# Patient Record
Sex: Female | Born: 1985 | Race: White | Hispanic: No | Marital: Single | Smoking: Never smoker
Health system: Southern US, Community
[De-identification: ages and names within clinical notes are randomized; demographics above are authoritative.]

## PROBLEM LIST (undated history)

## (undated) DIAGNOSIS — J45909 Unspecified asthma, uncomplicated: Secondary | ICD-10-CM

## (undated) HISTORY — DX: Unspecified asthma, uncomplicated: J45.909

## (undated) HISTORY — PX: TONSILLECTOMY AND ADENOIDECTOMY: SHX28

## (undated) HISTORY — PX: DILATION AND CURETTAGE OF UTERUS: SHX78

---
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
## 2013-02-17 ENCOUNTER — Encounter: Payer: Self-pay | Admitting: *Deleted

## 2013-03-01 ENCOUNTER — Ambulatory Visit (INDEPENDENT_AMBULATORY_CARE_PROVIDER_SITE_OTHER): Payer: 59 | Admitting: Nurse Practitioner

## 2013-03-01 ENCOUNTER — Encounter: Payer: Self-pay | Admitting: Nurse Practitioner

## 2013-03-01 VITALS — BP 123/77 | HR 80 | Ht 62.0 in | Wt 150.4 lb

## 2013-03-01 DIAGNOSIS — Z0000 Encounter for general adult medical examination without abnormal findings: Secondary | ICD-10-CM

## 2013-03-01 DIAGNOSIS — Z01419 Encounter for gynecological examination (general) (routine) without abnormal findings: Secondary | ICD-10-CM

## 2013-03-01 DIAGNOSIS — J45990 Exercise induced bronchospasm: Secondary | ICD-10-CM

## 2013-03-01 DIAGNOSIS — Z124 Encounter for screening for malignant neoplasm of cervix: Secondary | ICD-10-CM

## 2013-03-01 MED ORDER — BECLOMETHASONE DIPROPIONATE 80 MCG/ACT IN AERS: 1.0000 | INHALATION_SPRAY | Freq: Two times a day (BID) | RESPIRATORY_TRACT | Status: DC

## 2013-03-01 NOTE — Patient Instructions (Signed)
Nexplanon

## 2013-03-02 LAB — PAP IG W/ RFLX HPV ASCU

## 2013-03-05 DIAGNOSIS — J45990 Exercise induced bronchospasm: Secondary | ICD-10-CM | POA: Insufficient documentation

## 2013-03-05 NOTE — Assessment & Plan Note (Signed)
Add Qvar as directed. Continue albuterol inhaler before exercise. Call back if no improvement.

## 2013-03-05 NOTE — Progress Notes (Signed)
Subjective:    Patient ID: Jacqueline Swanson, female    DOB: February 28, 1986, 27 y.o.   MRN: [id number]  HPI presents for wellness checkup. Her last Pap smear was 2009 and according to patient was normal. Same sexual partner. No vaginal discharge. No pelvic pain. Is off her Depo-Provera due to side effects particularly weight gain and moodiness. Eating healthy diet. Staying active. Has not had a visual exam. Regular dental exams. According to patient she was diagnosed with 5 episodes of acute bronchitis at the local urgent care center from October to February this year. Was told she probably has asthma. Has frequent tightness and wheezing with intense exercise. Uses her inhaler right before activity and generally sometime during or after exercise. This began about 6-8 months ago. Mild problem with hot humid weather. Otherwise does not occur at any other time. Nonsmoker. Having very light cycles since coming off Depo-Provera. Would like to get Nexplanon.    Review of Systems  Constitutional: Negative for fever, activity change and appetite change.  HENT: Negative for ear pain, congestion, sore throat, rhinorrhea, dental problem and sinus pressure.   Eyes: Negative for visual disturbance.  Respiratory: Positive for shortness of breath and wheezing. Negative for cough and chest tightness.   Cardiovascular: Negative for chest pain and palpitations.  Gastrointestinal: Negative for nausea, vomiting, abdominal pain, diarrhea, constipation and blood in stool.  Genitourinary: Negative for vaginal discharge, difficulty urinating, menstrual problem and pelvic pain.  Skin: Negative for rash.  Neurological: Negative for headaches.       Objective:   Physical Exam  Vitals reviewed. Constitutional: She is oriented to person, place, and time. She appears well-developed. No distress.  HENT:  Right Ear: External ear normal.  Left Ear: External ear normal.  Mouth/Throat: Oropharynx is clear and moist.  Eyes:  Conjunctivae are normal. Pupils are equal, round, and reactive to light.  Neck: Normal range of motion. Neck supple. No tracheal deviation present. No thyromegaly present.  Cardiovascular: Normal rate, regular rhythm and normal heart sounds.  Exam reveals no gallop.   No murmur heard. Pulmonary/Chest: Effort normal and breath sounds normal. She has no wheezes.  Abdominal: Soft. She exhibits no distension. There is no tenderness.  Genitourinary: Vagina normal and uterus normal. No vaginal discharge found.  Musculoskeletal: She exhibits no edema.  Lymphadenopathy:    She has no cervical adenopathy.  Neurological: She is alert and oriented to person, place, and time.  Skin: Skin is warm and dry. No rash noted.  Psychiatric: Her behavior is normal.   breast exam no masses noted. Axilla no adenopathy. External GU normal limit. Vagina moist, no discharge noted. Cervix normal limit in appearance. No CMT. Uterus and adnexa normal limit and nontender.     Assessment & Plan:  Well woman exam - Plan: Pap IG w/ reflex to HPV when [facility], Ambulatory referral to Gynecology  Exercise-induced asthma  Screening for cervical cancer - Plan: Pap IG w/ reflex to HPV when [facility]  Meds ordered this encounter  Medications  . acetaminophen (TYLENOL) 500 MG tablet    Sig: Take 500 mg by mouth every 6 (six) hours as needed for pain.  . beclomethasone (QVAR) 80 MCG/ACT inhaler    Sig: Inhale 1 puff into the lungs 2 (two) times daily.    Dispense:  1 Inhaler    Refill:  11    Order Specific Question:  Supervising Provider    Answer:  Merlyn Albert [[id number]]   Continue albuterol inhaler 30 minutes  before exercise. Rinse mouth or brush teeth after using Qvar. Continues healthy diet and exercise. Discussed calcium and vitamin D supplementation. Call back if no improvement in her breathing. Refer to gynecology for Nexplanon insertion. Next physical in one year.

## 2013-03-27 ENCOUNTER — Ambulatory Visit (INDEPENDENT_AMBULATORY_CARE_PROVIDER_SITE_OTHER): Payer: 59 | Admitting: Women's Health

## 2013-03-27 ENCOUNTER — Encounter: Payer: Self-pay | Admitting: Women's Health

## 2013-03-27 VITALS — BP 120/70 | Ht 72.0 in | Wt 147.6 lb

## 2013-03-27 DIAGNOSIS — Z3009 Encounter for other general counseling and advice on contraception: Secondary | ICD-10-CM

## 2013-03-27 DIAGNOSIS — Z3049 Encounter for surveillance of other contraceptives: Secondary | ICD-10-CM

## 2013-03-27 NOTE — Progress Notes (Signed)
S: Jacqueline Swanson is a 27 y.o. female who is here today to discuss contraception.  Was on depo, but she had significant weight gain and depression/mood swings. Last injection was app 9-12 months ago per pt. Has been using condoms. Desires nexplanon, discussed r/b. Last sexual intercourse 7/6- used condom. Patient's last menstrual period was 03/07/2013.   O: BP 120/70  Ht 6' (1.829 m)  Wt 147 lb 9.6 oz (66.951 kg)  BMI 20.01 kg/m2  LMP 03/07/2013   A: Contraception counseling  P: No sex until nexplanon placed     Visit 6/16 or after for bHCG am and nexplanon insertion pm  Cheron Every [place], [place] 03/27/2013 12:57 PM

## 2013-03-27 NOTE — Patient Instructions (Signed)

## 2013-04-09 ENCOUNTER — Other Ambulatory Visit: Payer: Self-pay | Admitting: Adult Health

## 2013-04-09 ENCOUNTER — Encounter: Payer: Self-pay | Admitting: Women's Health

## 2013-04-09 ENCOUNTER — Ambulatory Visit (INDEPENDENT_AMBULATORY_CARE_PROVIDER_SITE_OTHER): Payer: 59 | Admitting: Women's Health

## 2013-04-09 ENCOUNTER — Other Ambulatory Visit: Payer: 59

## 2013-04-09 VITALS — BP 120/70 | Ht 62.0 in | Wt 149.5 lb

## 2013-04-09 DIAGNOSIS — Z3046 Encounter for surveillance of implantable subdermal contraceptive: Secondary | ICD-10-CM

## 2013-04-09 DIAGNOSIS — Z30017 Encounter for initial prescription of implantable subdermal contraceptive: Secondary | ICD-10-CM

## 2013-04-09 LAB — HCG, QUANTITATIVE, PREGNANCY: hCG, Beta Chain, Quant, S: <1 m[IU]/mL

## 2013-04-09 NOTE — Progress Notes (Signed)
Jacqueline Swanson is a 27 y.o. year old Caucasian female here for Nexplanon insertion.  Her LMP was 03/31/13, last sexual intercourse 03/30/13 and her bHCG pregnancy test today was negative.  Risks/benefits/side effects of Nexplanon have been discussed and her questions have been answered.  Specifically, a failure rate of 09/998 has been reported, with an increased failure rate if pt takes St. John's Wort and/or antiseizure medicaitons.  [name] is aware of the common side effect of irregular bleeding, which the incidence of decreases over time.  Her left arm, approximatly 4 inches proximal from the elbow, was cleansed with alcohol and anesthetized with 2cc of 2% Lidocaine.  The area was cleansed again w/ betadine and the Nexplanon was inserted without difficulty.  A steri-strip and pressure bandage were applied.  Pt was instructed to remove pressure bandage in a 24 hours, and keep insertion site covered with steri-strip for 3-5 days.  Back up contraception was recommended for 2 weeks.  Follow-up scheduled PRN problems  Exp: 05/2015 Lot: [id number]  Marge Duncans 04/09/2013 2:44 PM

## 2013-04-09 NOTE — Patient Instructions (Signed)
Please leave big bandage on for 24hours. The smaller strip you will take off in 3-5 days. Use condoms as back up protection for 2 weeks.   Etonogestrel implant What is this medicine? ETONOGESTREL is a contraceptive (birth control) device. It is used to prevent pregnancy. It can be used for up to 3 years. This medicine may be used for other purposes; ask your health care provider or pharmacist if you have questions. What should I tell my health care provider before I take this medicine? They need to know if you have any of these conditions: -abnormal vaginal bleeding -blood vessel disease or blood clots -cancer of the breast, cervix, or liver -depression -diabetes -gallbladder disease -headaches -heart disease or recent heart attack -high blood pressure -high cholesterol -kidney disease -liver disease -renal disease -seizures -tobacco smoker -an unusual or allergic reaction to etonogestrel, other hormones, anesthetics or antiseptics, medicines, foods, dyes, or preservatives -pregnant or trying to get pregnant -breast-feeding How should I use this medicine? This device is inserted just under the skin on the inner side of your upper arm by a health care professional. Talk to your pediatrician regarding the use of this medicine in children. Special care may be needed. Overdosage: If you think you've taken too much of this medicine contact a poison control center or emergency room at once. Overdosage: If you think you have taken too much of this medicine contact a poison control center or emergency room at once. NOTE: This medicine is only for you. Do not share this medicine with others. What if I miss a dose? This does not apply. What may interact with this medicine? Do not take this medicine with any of the following medications: -amprenavir -bosentan -fosamprenavir This medicine may also interact with the following medications: -barbiturate medicines for inducing sleep or  treating seizures -certain medicines for fungal infections like ketoconazole and itraconazole -griseofulvin -medicines to treat seizures like carbamazepine, felbamate, oxcarbazepine, phenytoin, topiramate -modafinil -phenylbutazone -rifampin -some medicines to treat HIV infection like atazanavir, indinavir, lopinavir, nelfinavir, tipranavir, ritonavir -St. John's wort This list may not describe all possible interactions. Give your health care provider a list of all the medicines, herbs, non-prescription drugs, or dietary supplements you use. Also tell them if you smoke, drink alcohol, or use illegal drugs. Some items may interact with your medicine. What should I watch for while using this medicine? This product does not protect you against HIV infection (AIDS) or other sexually transmitted diseases. You should be able to feel the implant by pressing your fingertips over the skin where it was inserted. Tell your doctor if you cannot feel the implant. What side effects may I notice from receiving this medicine? Side effects that you should report to your doctor or health care professional as soon as possible: -allergic reactions like skin rash, itching or hives, swelling of the face, lips, or tongue -breast lumps -changes in vision -confusion, trouble speaking or understanding -dark urine -depressed mood -general ill feeling or flu-like symptoms -light-colored stools -loss of appetite, nausea -right upper belly pain -severe headaches -severe pain, swelling, or tenderness in the abdomen -shortness of breath, chest pain, swelling in a leg -signs of pregnancy -sudden numbness or weakness of the face, arm or leg -trouble walking, dizziness, loss of balance or coordination -unusual vaginal bleeding, discharge -unusually weak or tired -yellowing of the eyes or skin Side effects that usually do not require medical attention (Report these to your doctor or health care professional if they  continue or are  bothersome.): -acne -breast pain -changes in weight -cough -fever or chills -headache -irregular menstrual bleeding -itching, burning, and vaginal discharge -pain or difficulty passing urine -sore throat This list may not describe all possible side effects. Call your doctor for medical advice about side effects. You may report side effects to FDA at 1-800-FDA-1088. Where should I keep my medicine? This drug is given in a hospital or clinic and will not be stored at home. NOTE: This sheet is a summary. It may not cover all possible information. If you have questions about this medicine, talk to your doctor, pharmacist, or health care provider.  2013, [employer]. (05/30/2009 3:54:17 PM)

## 2013-05-04 ENCOUNTER — Ambulatory Visit: Payer: 59 | Admitting: Nurse Practitioner

## 2014-07-22 ENCOUNTER — Encounter: Payer: Self-pay | Admitting: Women's Health

## 2015-04-21 ENCOUNTER — Encounter: Payer: Self-pay | Admitting: Family Medicine

## 2015-04-21 ENCOUNTER — Ambulatory Visit (INDEPENDENT_AMBULATORY_CARE_PROVIDER_SITE_OTHER): Payer: 59 | Admitting: Nurse Practitioner

## 2015-04-21 ENCOUNTER — Encounter: Payer: Self-pay | Admitting: Nurse Practitioner

## 2015-04-21 VITALS — BP 114/76 | Temp 98.2°F | Ht 62.0 in | Wt 158.4 lb

## 2015-04-21 DIAGNOSIS — M546 Pain in thoracic spine: Secondary | ICD-10-CM

## 2015-04-21 LAB — POCT URINALYSIS DIPSTICK
PH UA: 6
Spec Grav, UA: 1.01

## 2015-04-21 MED ORDER — MELOXICAM 15 MG PO TABS: 15.0000 mg | ORAL_TABLET | Freq: Every day | ORAL | Status: DC

## 2015-04-21 MED ORDER — AMITRIPTYLINE HCL 10 MG PO TABS: 10.0000 mg | ORAL_TABLET | Freq: Every day | ORAL | Status: DC
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
## 2015-04-23 ENCOUNTER — Encounter: Payer: Self-pay | Admitting: Nurse Practitioner

## 2015-04-24 ENCOUNTER — Other Ambulatory Visit: Payer: Self-pay | Admitting: Nurse Practitioner

## 2015-04-24 ENCOUNTER — Ambulatory Visit (HOSPITAL_COMMUNITY)
Admission: RE | Admit: 2015-04-24 | Discharge: 2015-04-24 | Disposition: A | Discharge status: Home / Self Care | Payer: 59 | Source: Ambulatory Visit | Attending: Nurse Practitioner | Admitting: Nurse Practitioner

## 2015-04-24 DIAGNOSIS — M546 Pain in thoracic spine: Secondary | ICD-10-CM

## 2015-04-24 MED ORDER — CHLORZOXAZONE 500 MG PO TABS
500.0000 mg | ORAL_TABLET | Freq: Three times a day (TID) | ORAL | Status: DC | PRN
Start: 2015-04-24 — End: 2015-09-23

## 2015-09-23 ENCOUNTER — Ambulatory Visit (INDEPENDENT_AMBULATORY_CARE_PROVIDER_SITE_OTHER): Payer: Self-pay | Admitting: Family Medicine

## 2015-09-23 ENCOUNTER — Ambulatory Visit (HOSPITAL_COMMUNITY)
Admission: RE | Admit: 2015-09-23 | Discharge: 2015-09-23 | Disposition: A | Discharge status: Home / Self Care | Payer: 59 | Source: Ambulatory Visit | Attending: Family Medicine | Admitting: Family Medicine

## 2015-09-23 ENCOUNTER — Encounter: Payer: Self-pay | Admitting: Family Medicine

## 2015-09-23 VITALS — BP 116/74 | Ht 62.0 in | Wt 160.0 lb

## 2015-09-23 DIAGNOSIS — M546 Pain in thoracic spine: Secondary | ICD-10-CM | POA: Diagnosis not present

## 2015-09-23 DIAGNOSIS — M25561 Pain in right knee: Secondary | ICD-10-CM

## 2015-09-23 DIAGNOSIS — S82091A Other fracture of right patella, initial encounter for closed fracture: Secondary | ICD-10-CM | POA: Insufficient documentation

## 2015-09-23 DIAGNOSIS — M419 Scoliosis, unspecified: Secondary | ICD-10-CM | POA: Diagnosis not present

## 2015-09-23 DIAGNOSIS — M25461 Effusion, right knee: Secondary | ICD-10-CM | POA: Insufficient documentation

## 2015-09-23 MED ORDER — CHLORZOXAZONE 500 MG PO TABS: ORAL_TABLET | ORAL | Status: DC

## 2015-09-23 MED ORDER — HYDROCODONE-ACETAMINOPHEN 5-325 MG PO TABS: ORAL_TABLET | ORAL | Status: DC

## 2015-09-23 MED ORDER — CEPHALEXIN 500 MG PO CAPS: ORAL_CAPSULE | ORAL | Status: DC

## 2015-09-23 MED ORDER — ETODOLAC 400 MG PO TABS: ORAL_TABLET | ORAL | Status: DC
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
## 2015-09-24 ENCOUNTER — Encounter: Payer: Self-pay | Admitting: Family Medicine

## 2015-09-24 NOTE — Addendum Note (Signed)
Addended by: Margaretha SheffieldBROWN, Antwanette Wesche S on: 09/24/2015 11:39 AM   Modules accepted: Orders

## 2015-10-03 ENCOUNTER — Encounter: Payer: Self-pay | Admitting: Family Medicine

## 2015-10-03 ENCOUNTER — Ambulatory Visit (INDEPENDENT_AMBULATORY_CARE_PROVIDER_SITE_OTHER): Payer: 59 | Admitting: Family Medicine

## 2015-10-03 VITALS — BP 100/68 | Ht 62.0 in | Wt 160.0 lb

## 2015-10-03 DIAGNOSIS — Z029 Encounter for administrative examinations, unspecified: Secondary | ICD-10-CM

## 2015-10-03 DIAGNOSIS — M25561 Pain in right knee: Secondary | ICD-10-CM

## 2015-10-03 DIAGNOSIS — M546 Pain in thoracic spine: Secondary | ICD-10-CM

## 2015-10-03 MED ORDER — ETODOLAC 400 MG PO TABS: ORAL_TABLET | ORAL | Status: DC

## 2015-10-03 MED ORDER — HYDROCODONE-ACETAMINOPHEN 5-325 MG PO TABS: ORAL_TABLET | ORAL | Status: DC

## 2015-10-03 MED ORDER — TIZANIDINE HCL 4 MG PO TABS: 4.0000 mg | ORAL_TABLET | Freq: Three times a day (TID) | ORAL | Status: DC | PRN
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
## 2015-10-07 ENCOUNTER — Encounter: Payer: Self-pay | Admitting: Family Medicine

## 2015-10-13 ENCOUNTER — Ambulatory Visit (HOSPITAL_COMMUNITY): Payer: 59 | Attending: Family Medicine | Admitting: Physical Therapy

## 2015-10-13 DIAGNOSIS — R269 Unspecified abnormalities of gait and mobility: Secondary | ICD-10-CM | POA: Diagnosis present

## 2015-10-13 DIAGNOSIS — R29898 Other symptoms and signs involving the musculoskeletal system: Secondary | ICD-10-CM | POA: Insufficient documentation

## 2015-10-13 DIAGNOSIS — IMO0002 Reserved for concepts with insufficient information to code with codable children: Secondary | ICD-10-CM

## 2015-10-13 DIAGNOSIS — M545 Low back pain, unspecified: Secondary | ICD-10-CM

## 2015-10-13 DIAGNOSIS — M25561 Pain in right knee: Secondary | ICD-10-CM | POA: Diagnosis not present

## 2015-10-13 DIAGNOSIS — G8929 Other chronic pain: Secondary | ICD-10-CM | POA: Insufficient documentation

## 2015-10-13 DIAGNOSIS — M2560 Stiffness of unspecified joint, not elsewhere classified: Secondary | ICD-10-CM | POA: Insufficient documentation
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
## 2015-10-13 NOTE — Patient Instructions (Addendum)
Strengthening: Quadriceps Set    Tighten muscles on top of thighs by pushing knees down into surface. Hold __3__ seconds. Repeat _10___ times per set. Do _1___ sets per session. Do __4__ sessions per day.  http://orth.exer.us/602   Copyright  VHI. All rights reserved.  Isometric Abdominal    Lying on back with knees bent, tighten stomach by pressing elbows down. Hold _4___ seconds. Repeat ___10_ times per set. Do _1___ sets per session. Do __4__ sessions per day.  http://orth.exer.us/1086   Copyright  VHI. All rights reserved.  Self-Mobilization: Heel Slide (Supine)    Slide right  heel toward buttocks until a gentle stretch is felt. Hold _1___ seconds. Relax. Repeat __10__ times per set. Do __1__ sets per session. Do _2___ sessions per day.  http://orth.exer.us/710   Copyright  VHI. All rights reserved.  Stretching: Hamstring (Supine)    Supporting right thigh behind knee, slowly straighten knee until stretch is felt in back of thigh. Hold __30__ seconds. Repeat _3__ times per set. Do __1__ sets per session. Do __3__ sessions per day.  http://orth.exer.us/656   Copyright  VHI. All rights reserved.  Gluteal Sets    Tighten buttocks while pressing pelvis to floor. Hold _5___ seconds. Repeat ___10_ times per set. Do ____ sets per session. Do _2___ sessions per day. 1 http://orth.exer.us/104   Copyright  VHI. All rights reserved.  Knee-to-Chest Stretch: Unilateral    With hand behind right knee, pull knee in to chest until a comfortable stretch is felt in lower back and buttocks. Keep back relaxed. Hold _30___ seconds. Repeat _3___ times per set. Do _1___ sets per session. Do 2____ sessions per day. 3 http://orth.exer.us/126   Copyright  VHI. All rights reserved.  Self-Mobilization: Knee Flexion (Prone)    Bring right  heel toward buttocks as close as possible. Hold _2___ seconds. Relax. Repeat ___10_ times per set. Do1 ____ sets per session. Do  __2__ sessions per day.  http://orth.exer.us/596   Copyright  VHI. All rights reserved.

## 2015-10-20 ENCOUNTER — Ambulatory Visit (HOSPITAL_COMMUNITY): Payer: 59 | Admitting: Physical Therapy

## 2015-10-20 DIAGNOSIS — G8929 Other chronic pain: Secondary | ICD-10-CM

## 2015-10-20 DIAGNOSIS — M2560 Stiffness of unspecified joint, not elsewhere classified: Secondary | ICD-10-CM

## 2015-10-20 DIAGNOSIS — M25561 Pain in right knee: Principal | ICD-10-CM

## 2015-10-20 DIAGNOSIS — M545 Low back pain, unspecified: Secondary | ICD-10-CM

## 2015-10-20 DIAGNOSIS — R269 Unspecified abnormalities of gait and mobility: Secondary | ICD-10-CM

## 2015-10-20 DIAGNOSIS — IMO0002 Reserved for concepts with insufficient information to code with codable children: Secondary | ICD-10-CM

## 2015-10-20 DIAGNOSIS — R29898 Other symptoms and signs involving the musculoskeletal system: Secondary | ICD-10-CM

## 2015-10-20 NOTE — Therapy (Signed)
[facility] [address] [place], [place], [address] Phone: [phone]   Fax:  [phone]  Physical Therapy Treatment  Patient Details  Name: Jacqueline Swanson MRN: [id number] Date of Birth: Nov 13, 1985 Referring Provider: Ardyth Gal   Encounter Date: 10/20/2015      PT End of Session - 10/20/15 1137    Visit Number 2   Number of Visits 12   Date for PT Re-Evaluation 11/12/15   Authorization Type UHC   Authorization - Visit Number 2   Authorization - Number of Visits 10   PT Start Time 1103   PT Stop Time 1141   PT Time Calculation (min) 38 min      Past Medical History  Diagnosis Date  . Asthma     Past Surgical History  Procedure Laterality Date  . Dilation and curettage of uterus    . Tonsillectomy and adenoidectomy      There were no vitals filed for this visit.  Visit Diagnosis:  Knee pain, chronic, right  Midline low back pain without sciatica  Abnormality of gait  Right leg weakness  Stiffness of joint of lower extremity      Subjective Assessment - 10/20/15 1105    Subjective Ms. Dalal states that she completed the exercises and has not questions.  She states that she saw the MD who gave her orders to continue therapy longer than 2 weeks.     Pertinent History fx thoracic vertebraes13 years ago;    Currently in Pain? Yes   Pain Score 4    Pain Location Back   Pain Orientation Lower   Pain Descriptors / Indicators Aching   Pain Type Acute pain;Chronic pain   Pain Onset More than a month ago   Pain Frequency Constant   Aggravating Factors  activity   Pain Relieving Factors lying down   Multiple Pain Sites Yes   Pain Score 3   Pain Orientation Right   Pain Descriptors / Indicators Burning   Pain Onset More than a month ago   Aggravating Factors  activity   Pain Relieving Factors rest                         [facility] Adult PT Treatment/Exercise - 10/20/15 0001    Exercises   Exercises Lumbar;Knee/Hip   Lumbar Exercises: Stretches   Active Hamstring Stretch 2 reps;30 seconds   Single Knee to Chest Stretch 3 reps;30 seconds   Standing Extension 10 seconds   Quad Stretch 3 reps;30 seconds   Quad Stretch Limitations prone rope    Lumbar Exercises: Standing   Heel Raises 10 reps   Functional Squats 10 reps   Other Standing Lumbar Exercises SLS x 5 reps max 10"    Lumbar Exercises: Supine   Ab Set 5 reps   Glut Set 5 reps   Clam 10 reps   Bridge 10 reps   Straight Leg Raise 10 reps   Other Supine Lumbar Exercises Short arc quad neutral and external rotation x 10 reps each    Knee/Hip Exercises: Seated   Long Arc Quad 10 reps   Knee/Hip Exercises: Supine   Quad Sets Right;10 reps   Heel Slides 10 reps                  PT Short Term Goals - 10/13/15 1250    PT SHORT TERM GOAL #1   Title Pt to be independent in HEP  in order to achiever goals in an expedient manner    Time 1   Period Weeks   Status New   PT SHORT TERM GOAL #2   Title Pt knee ROM to be 0-105 degrees to allow pt to have a normalized gait    Time 1   Period Weeks   Status New   PT SHORT TERM GOAL #3   Title Pt knee and back pain to be at the most a 6/10 for improved quality of life   Time 1   Period Weeks           PT Long Term Goals - 10/20/15 1127    PT LONG TERM GOAL #1   Title Pt to be independent in an advance HEP to obtain goals in an expedent manner.    Time 2   Period Weeks   Status On-going   PT LONG TERM GOAL #2   Title Pt to be able to sit for 30 minutes with comfort in order to be able to sit for a meal in comfort.    Time 2   Period Weeks   Status On-going   PT LONG TERM GOAL #3   Title Pt to be able to walk for 20 minutes without increased pain to be able to take care of her child    Time 2   Period Weeks   Status On-going   PT LONG TERM GOAL #4   Title Pt strength to be increased 1/2 grade to be able to squat down and pick items off the ground.     Time 2   Period Weeks   Status On-going   PT LONG TERM GOAL #5   Title Pt to be able to be up for over an hour without increased pain to be able to complete shopping activity for household duties    Time 4   Period Weeks   Additional Long Term Goals   Additional Long Term Goals Yes   PT LONG TERM GOAL #6   Title Pt strength to be increased by one grade to be able to go up and down steps in a reciprocal manner without increased pain    Time 4   Period Weeks   PT LONG TERM GOAL #7   Title Pt pain to be no greater than a 2/10 throughout the day while completing normal ADL's    Time 4   Period Weeks   Status New               Plan - 10/20/15 1123    Clinical Impression Statement Pt ROM is improving but still is unable to complete a heelslide greater than 75 degrees.  Pt instructed in new strengthening with minimal verbal cuing needed for proper technique.  Pt given HEP for new exercises.     Pt will benefit from skilled therapeutic intervention in order to improve on the following deficits Abnormal gait;Decreased activity tolerance;Decreased mobility;Decreased range of motion;Decreased strength;Difficulty walking;Impaired flexibility;Pain   PT Frequency 3x / week   PT Duration 4 weeks   PT Treatment/Interventions Functional mobility training;Therapeutic activities;Therapeutic exercise;Manual techniques;Patient/family education   PT Next Visit Plan begin standing knee flexion standing terminal extension, and step ;up exercises    PT Home Exercise Plan given   Consulted and Agree with Plan of Care Patient        Problem List Patient Active Problem List   Diagnosis Date Noted  . Exercise-induced asthma 03/05/2013    Virgina Organ,  PT CLT [phone] 10/20/2015, 11:40 AM  [facility] [address] [place], [place], [address] Phone: [phone]   Fax:  [phone]  Name: Jacqueline Swanson MRN: [id number] Date of Birth:  July 29, 1986

## 2015-10-20 NOTE — Patient Instructions (Signed)
Strengthening: Terminal Knee Extension (Supine)    With right knee over bolster, straighten knee by tightening muscles on top of thigh. Keep bottom of knee on bolster. Repeat __10__ times per set. Do _1___ sets per session. Do __2__ sessions per day. Repeat with your toes out to 2 o'clock  http://orth.exer.us/626   Copyright  VHI. All rights reserved.  Hip Abduction / Adduction: with Knee Flexion (Supine)    With right knee bent, gently lower knee to side and return. Repeat __10__ times per set. Do ___1_ sets per session. Do ___2_ sessions per day.  http://orth.exer.us/682   Copyright  VHI. All rights reserved.  Bridging    Slowly raise buttocks from floor, keeping stomach tight. Repeat ____ times per set. Do ____ sets per session. Do ____ sessions per day.  http://orth.exer.us/1096   Copyright  VHI. All rights reserved.  Heel Raise: Bilateral (Standing)    Rise on balls of feet. Repeat _10-15___ times per set. Do ___1_ sets per session. Do 2____ sessions per day.  http://orth.exer.us/38   Copyright  VHI. All rights reserved.  Functional Quadriceps: Chair Squat    Keeping feet flat on floor, shoulder width apart, squat as low as is comfortable. Use support as necessary. Repeat _10-15___ times per set. Do __1__ sets per session. Do _2___ sessions per day.  http://orth.exer.us/736   Copyright  VHI. All rights reserved.

## 2015-10-22 ENCOUNTER — Ambulatory Visit (HOSPITAL_COMMUNITY): Payer: 59 | Attending: Family Medicine

## 2015-10-22 DIAGNOSIS — IMO0002 Reserved for concepts with insufficient information to code with codable children: Secondary | ICD-10-CM

## 2015-10-22 DIAGNOSIS — R29898 Other symptoms and signs involving the musculoskeletal system: Secondary | ICD-10-CM | POA: Insufficient documentation

## 2015-10-22 DIAGNOSIS — M25561 Pain in right knee: Secondary | ICD-10-CM | POA: Diagnosis not present

## 2015-10-22 DIAGNOSIS — M545 Low back pain, unspecified: Secondary | ICD-10-CM

## 2015-10-22 DIAGNOSIS — M2560 Stiffness of unspecified joint, not elsewhere classified: Secondary | ICD-10-CM | POA: Insufficient documentation

## 2015-10-22 DIAGNOSIS — R269 Unspecified abnormalities of gait and mobility: Secondary | ICD-10-CM | POA: Diagnosis present

## 2015-10-22 DIAGNOSIS — G8929 Other chronic pain: Secondary | ICD-10-CM | POA: Insufficient documentation

## 2015-10-22 NOTE — Therapy (Signed)
[facility] [address] [place], [place], [address] Phone: [phone]   Fax:  [phone]  Physical Therapy Treatment  Patient Details  Name: Jacqueline Swanson MRN: [id number] Date of Birth: 1986-05-12 Referring Provider: Ardyth Gal PCP; [facility]  Encounter Date: 10/22/2015      PT End of Session - 10/22/15 0856    Visit Number 3   Number of Visits 12   Date for PT Re-Evaluation 11/12/15   Authorization Type UHC   Authorization - Visit Number 3   Authorization - Number of Visits 10   PT Start Time 0847   PT Stop Time 0931   PT Time Calculation (min) 44 min      Past Medical History  Diagnosis Date  . Asthma     Past Surgical History  Procedure Laterality Date  . Dilation and curettage of uterus    . Tonsillectomy and adenoidectomy      There were no vitals filed for this visit.  Visit Diagnosis:  Knee pain, chronic, right  Midline low back pain without sciatica  Abnormality of gait  Right leg weakness  Stiffness of joint of lower extremity      Subjective Assessment - 10/22/15 0845    Subjective Pt stated knee and back pain scale 2/10, sore and achey.  Pt brought referral for [facility] orthopedic to extend PT    Pertinent History fx thoracic vertebraes13 years ago;    Patient Stated Goals to be able to walk and bend her knee again    Currently in Pain? Yes   Pain Score 2    Pain Location Back   Pain Orientation Lower;Mid   Pain Descriptors / Indicators Aching;Sore   Pain Score 2   Pain Location Knee   Pain Orientation Right   Pain Descriptors / Indicators Sore;Aching            [facility] PT Assessment - 10/22/15 0001    Assessment   Medical Diagnosis lumbar/ Rt knee pain    Referring Provider Ardyth Gal PCP; Zachery [facility]   Onset Date/Surgical Date 09/12/15   Hand Dominance Right   Next MD Visit Luking 02/03/201Zachery Dauer 11/10/2015   Prior Therapy none    Precautions   Precautions Fall   Restrictions   Weight Bearing Restrictions No            [facility] Adult PT Treatment/Exercise - 10/22/15 0001    Lumbar Exercises: Stretches   Active Hamstring Stretch 3 reps;30 seconds   Single Knee to Chest Stretch 5 reps;20 seconds   Quad Stretch 3 reps;30 seconds   Quad Stretch Limitations prone rope    Lumbar Exercises: Standing   Functional Squats 10 reps;3 seconds   Other Standing Lumbar Exercises SLS Rt 29" Lt 41" max of 3   Lumbar Exercises: Supine   Ab Set 10 reps;5 seconds   Clam 10 reps;3 seconds   Bridge 15 reps   Straight Leg Raise 10 reps   Lumbar Exercises: Sidelying   Hip Abduction 10 reps   Lumbar Exercises: Prone   Straight Leg Raise 10 reps   Knee/Hip Exercises: Standing   Knee Flexion 10 reps   Terminal Knee Extension Limitations 10x5" RTB   Functional Squat 10 reps   Gait Training Cueing for heel to toe pattern, knee extension and to reduce circumduction Rt LE x 61ft   Knee/Hip Exercises: Supine   Quad Sets Right;10 reps   Short Arc Quad Sets 10 reps  Heel Slides 10 reps   Heel Slides Limitations 3-98              PT Short Term Goals - 10/13/15 1250    PT SHORT TERM GOAL #1   Title Pt to be independent in HEP in order to achiever goals in an expedient manner    Time 1   Period Weeks   Status New   PT SHORT TERM GOAL #2   Title Pt knee ROM to be 0-105 degrees to allow pt to have a normalized gait    Time 1   Period Weeks   Status New   PT SHORT TERM GOAL #3   Title Pt knee and back pain to be at the most a 6/10 for improved quality of life   Time 1   Period Weeks           PT Long Term Goals - 10/20/15 1127    PT LONG TERM GOAL #1   Title Pt to be independent in an advance HEP to obtain goals in an expedent manner.    Time 2   Period Weeks   Status On-going   PT LONG TERM GOAL #2   Title Pt to be able to sit for 30 minutes with comfort in order to be able to sit for a meal in comfort.     Time 2   Period Weeks   Status On-going   PT LONG TERM GOAL #3   Title Pt to be able to walk for 20 minutes without increased pain to be able to take care of her child    Time 2   Period Weeks   Status On-going   PT LONG TERM GOAL #4   Title Pt strength to be increased 1/2 grade to be able to squat down and pick items off the ground.    Time 2   Period Weeks   Status On-going   PT LONG TERM GOAL #5   Title Pt to be able to be up for over an hour without increased pain to be able to complete shopping activity for household duties    Time 4   Period Weeks   Additional Long Term Goals   Additional Long Term Goals Yes   PT LONG TERM GOAL #6   Title Pt strength to be increased by one grade to be able to go up and down steps in a reciprocal manner without increased pain    Time 4   Period Weeks   PT LONG TERM GOAL #7   Title Pt pain to be no greater than a 2/10 throughout the day while completing normal ADL's    Time 4   Period Weeks   Status New               Plan - 10/22/15 0915    Clinical Impression Statement Knee ROM is progressing well with AROM 3-98 degrees today.  Session focus on improving core and proximal musculature strengthening and knee mobiltiy with exercises and functional stretches.  Pt able to demonstrate appropriate form with all exercises with minimal cueing.  Added standing knee ROM based activities with good form demonstrated.  Gait training to improve knee extension with heel  strike, heel to toe pattern and to reduce circumduction to improve mechanics.  Pt encouraged to apply ice with elevation for pain and edema control to knee  following session.     PT Next Visit Plan Continue with core and proximal musculature strenghtening,  knee mobility.  Begin step up training as knee mobility improves.        Problem List Patient Active Problem List   Diagnosis Date Noted  . Exercise-induced asthma 03/05/2013   Becky Sax, LPTA;  [facility] [phone]  Juel Burrow 10/22/2015, 9:41 AM  [facility] [address] [place], [place], [address] Phone: [phone]   Fax:  [phone]  Name: Jacqueline Swanson MRN: [id number] Date of Birth: May 17, 1986

## 2015-10-23 ENCOUNTER — Ambulatory Visit (HOSPITAL_COMMUNITY): Payer: 59 | Admitting: Physical Therapy

## 2015-10-23 DIAGNOSIS — M545 Low back pain, unspecified: Secondary | ICD-10-CM

## 2015-10-23 DIAGNOSIS — G8929 Other chronic pain: Secondary | ICD-10-CM

## 2015-10-23 DIAGNOSIS — R269 Unspecified abnormalities of gait and mobility: Secondary | ICD-10-CM

## 2015-10-23 DIAGNOSIS — M25561 Pain in right knee: Secondary | ICD-10-CM | POA: Diagnosis not present

## 2015-10-23 DIAGNOSIS — M2560 Stiffness of unspecified joint, not elsewhere classified: Secondary | ICD-10-CM

## 2015-10-23 DIAGNOSIS — IMO0002 Reserved for concepts with insufficient information to code with codable children: Secondary | ICD-10-CM

## 2015-10-23 DIAGNOSIS — R29898 Other symptoms and signs involving the musculoskeletal system: Secondary | ICD-10-CM

## 2015-10-23 NOTE — Therapy (Signed)
[facility] [address] [place], [place], [address] Phone: [phone]   Fax:  [phone]  Physical Therapy Treatment  Patient Details  Name: Jacqueline Swanson MRN: [id number] Date of Birth: Apr 06, 1986 Referring Provider: Ardyth Gal PCP; [facility]  Encounter Date: 10/23/2015      PT End of Session - 10/23/15 1732    Visit Number 4   Number of Visits 12   Date for PT Re-Evaluation 11/12/15   Authorization Type UHC   Authorization - Visit Number 4   Authorization - Number of Visits 10   PT Start Time 1647   PT Stop Time 1728   PT Time Calculation (min) 41 min   Activity Tolerance Patient tolerated treatment well   Behavior During Therapy [facility] for tasks assessed/performed      Past Medical History  Diagnosis Date  . Asthma     Past Surgical History  Procedure Laterality Date  . Dilation and curettage of uterus    . Tonsillectomy and adenoidectomy      There were no vitals filed for this visit.  Visit Diagnosis:  Knee pain, chronic, right  Midline low back pain without sciatica  Abnormality of gait  Right leg weakness  Stiffness of joint of lower extremity      Subjective Assessment - 10/23/15 1649    Subjective Patient arrived without knee immobilizer today, reports that her MD had taken her out of this.    Pertinent History fx thoracic vertebraes13 years ago;    Currently in Pain? Yes   Pain Score 5    Pain Location Back  and knee    Pain Orientation Lower   Pain Descriptors / Indicators Aching;Sore   Pain Type Acute pain;Chronic pain   Pain Onset More than a month ago                         [facility] Adult PT Treatment/Exercise - 10/23/15 0001    Lumbar Exercises: Stretches   Active Hamstring Stretch 2 reps;30 seconds   Active Hamstring Stretch Limitations supine with rope    Single Knee to Chest Stretch 5 reps;20 seconds   Quad Stretch 3 reps;30 seconds   Quad  Stretch Limitations prone rope    Lumbar Exercises: Standing   Heel Raises 15 reps   Heel Raises Limitations toe and heel raises    Other Standing Lumbar Exercises 3D hip excursions 1x10   Lumbar Exercises: Seated   Sit to Stand Limitations seated on blue air pad: lateral flexion 1x10; opposite UE/LE flexion 1x10     Lumbar Exercises: Supine   Ab Set 10 reps   AB Set Limitations 3 second holds    Bridge 15 reps   Straight Leg Raise 15 reps   Other Supine Lumbar Exercises --   Lumbar Exercises: Sidelying   Clam 10 reps   Hip Abduction 10 reps   Lumbar Exercises: Prone   Straight Leg Raise 10 reps   Knee/Hip Exercises: Standing   Gait Training gait 2x257ft, cues for heel toe and TKE; encouraged to walk slowly with forcus on form and reduced circumduction    Knee/Hip Exercises: Supine   Quad Sets Right;Both;1 set;15 reps   Short Arc Quad Sets 10 reps   Heel Slides 15 reps   Knee/Hip Exercises: Prone   Hamstring Curl 1 set;10 reps                PT Education -  10/23/15 1732    Education provided No          PT Short Term Goals - 10/13/15 1250    PT SHORT TERM GOAL #1   Title Pt to be independent in HEP in order to achiever goals in an expedient manner    Time 1   Period Weeks   Status New   PT SHORT TERM GOAL #2   Title Pt knee ROM to be 0-105 degrees to allow pt to have a normalized gait    Time 1   Period Weeks   Status New   PT SHORT TERM GOAL #3   Title Pt knee and back pain to be at the most a 6/10 for improved quality of life   Time 1   Period Weeks           PT Long Term Goals - 10/20/15 1127    PT LONG TERM GOAL #1   Title Pt to be independent in an advance HEP to obtain goals in an expedent manner.    Time 2   Period Weeks   Status On-going   PT LONG TERM GOAL #2   Title Pt to be able to sit for 30 minutes with comfort in order to be able to sit for a meal in comfort.    Time 2   Period Weeks   Status On-going   PT LONG TERM GOAL #3    Title Pt to be able to walk for 20 minutes without increased pain to be able to take care of her child    Time 2   Period Weeks   Status On-going   PT LONG TERM GOAL #4   Title Pt strength to be increased 1/2 grade to be able to squat down and pick items off the ground.    Time 2   Period Weeks   Status On-going   PT LONG TERM GOAL #5   Title Pt to be able to be up for over an hour without increased pain to be able to complete shopping activity for household duties    Time 4   Period Weeks   Additional Long Term Goals   Additional Long Term Goals Yes   PT LONG TERM GOAL #6   Title Pt strength to be increased by one grade to be able to go up and down steps in a reciprocal manner without increased pain    Time 4   Period Weeks   PT LONG TERM GOAL #7   Title Pt pain to be no greater than a 2/10 throughout the day while completing normal ADL's    Time 4   Period Weeks   Status New               Plan - 10/23/15 1733    Clinical Impression Statement Continued with functional stretches and exercises today, also continued with focus on core and proximal strength, also knee mobility exercises. Good form today, only occasional cues. Introduced seated core exercise with good performance and minimal discomofrt. Finished session with gait training and encouraged patient to ambulate slower with more emphasis on form and correct mechanics in order to promote efficent movement and reduce strain on kinetic chain due to impairments. Improved gait pattern at end of session.    Pt will benefit from skilled therapeutic intervention in order to improve on the following deficits Abnormal gait;Decreased activity tolerance;Decreased mobility;Decreased range of motion;Decreased strength;Difficulty walking;Impaired flexibility;Pain   Rehab Potential Good  PT Frequency 3x / week   PT Duration 4 weeks   PT Treatment/Interventions Functional mobility training;Therapeutic activities;Therapeutic  exercise;Manual techniques;Patient/family education   PT Next Visit Plan Continue with core and proximal musculature strenghtening, knee mobility.  Begin step up training as knee mobility improves.   PT Home Exercise Plan given   Consulted and Agree with Plan of Care Patient        Problem List Patient Active Problem List   Diagnosis Date Noted  . Exercise-induced asthma 03/05/2013    Nedra Hai PT, DPT [phone]  [facility] [address] [place], [place], [address] Phone: [phone]   Fax:  [phone]  Name: Jacqueline Swanson MRN: [id number] Date of Birth: [birth year]

## 2015-10-24 ENCOUNTER — Ambulatory Visit: Payer: 59 | Admitting: Family Medicine

## 2015-10-27 ENCOUNTER — Ambulatory Visit (INDEPENDENT_AMBULATORY_CARE_PROVIDER_SITE_OTHER): Payer: 59 | Admitting: Family Medicine

## 2015-10-27 ENCOUNTER — Ambulatory Visit (HOSPITAL_COMMUNITY): Payer: 59

## 2015-10-27 ENCOUNTER — Encounter: Payer: Self-pay | Admitting: Family Medicine

## 2015-10-27 VITALS — BP 110/72 | Ht 62.0 in | Wt 160.0 lb

## 2015-10-27 DIAGNOSIS — M25561 Pain in right knee: Principal | ICD-10-CM

## 2015-10-27 DIAGNOSIS — IMO0002 Reserved for concepts with insufficient information to code with codable children: Secondary | ICD-10-CM

## 2015-10-27 DIAGNOSIS — M545 Low back pain, unspecified: Secondary | ICD-10-CM

## 2015-10-27 DIAGNOSIS — M546 Pain in thoracic spine: Secondary | ICD-10-CM

## 2015-10-27 DIAGNOSIS — G8929 Other chronic pain: Secondary | ICD-10-CM

## 2015-10-27 DIAGNOSIS — R269 Unspecified abnormalities of gait and mobility: Secondary | ICD-10-CM

## 2015-10-27 DIAGNOSIS — R29898 Other symptoms and signs involving the musculoskeletal system: Secondary | ICD-10-CM

## 2015-10-27 DIAGNOSIS — M2560 Stiffness of unspecified joint, not elsewhere classified: Secondary | ICD-10-CM

## 2015-10-27 NOTE — Therapy (Signed)
[facility] [address] [place], [place], [address] Phone: [phone]   Fax:  [phone]  Physical Therapy Treatment  Patient Details  Name: Jacqueline Swanson MRN: [id number] Date of Birth: 1985/10/13 Referring Provider: Ardyth Gal PCP; [facility]  Encounter Date: 10/27/2015      PT End of Session - 10/27/15 0950    Visit Number 5   Number of Visits 12   Date for PT Re-Evaluation 11/12/15   Authorization Type UHC   Authorization - Visit Number 5   Authorization - Number of Visits 10   PT Start Time 0933   PT Stop Time 1014   PT Time Calculation (min) 41 min   Activity Tolerance Patient tolerated treatment well   Behavior During Therapy [facility] for tasks assessed/performed      Past Medical History  Diagnosis Date  . Asthma     Past Surgical History  Procedure Laterality Date  . Dilation and curettage of uterus    . Tonsillectomy and adenoidectomy      There were no vitals filed for this visit.  Visit Diagnosis:  Knee pain, chronic, right  Midline low back pain without sciatica  Abnormality of gait  Right leg weakness  Stiffness of joint of lower extremity      Subjective Assessment - 10/27/15 0937    Subjective Pt noted that her R knee pain is getting better each day and that she has noticed that her R knee is more mobile. No new complaints reported upon arrival. Pt reported consistent compliance with her HEP with no issues encountered.    Pertinent History fx thoracic vertebraes13 years ago;    Currently in Pain? Yes   Pain Score 3    Pain Location Back   Pain Orientation Lower   Pain Descriptors / Indicators Aching;Sore   Pain Type Chronic pain   Pain Radiating Towards B hips    Pain Onset More than a month ago   Pain Frequency Intermittent   Pain Score 4   Pain Location Knee   Pain Orientation Right   Pain Descriptors / Indicators Aching;Sore   Pain Type Acute pain   Pain Onset More  than a month ago   Pain Frequency Intermittent   Aggravating Factors  max R knee flexion and WB activities    Pain Relieving Factors rest    Effect of Pain on Daily Activities difficulty with squats                          [facility] Adult PT Treatment/Exercise - 10/27/15 0001    Lumbar Exercises: Stretches   Active Hamstring Stretch 3 reps;30 seconds;Other (comment)  R LE    Active Hamstring Stretch Limitations supine with rope    Single Knee to Chest Stretch 5 reps;20 seconds   Quad Stretch 3 reps;30 seconds;Other (comment)  R LE only    Quad Stretch Limitations supine in [place] position with use of rope    Lumbar Exercises: Standing   Functional Squats 10 reps;3 seconds   Functional Squats Limitations squat to 35 degees with physio ball on wall    Other Standing Lumbar Exercises 3D hip excursions 1x10   Lumbar Exercises: Supine   Ab Set 15 reps   AB Set Limitations 3 second holds    Bridge 15 reps   Straight Leg Raise 15 reps;Other (comment)  2 sets   Lumbar Exercises: Sidelying   Clam 15 reps;Other (comment)  with red TB    Hip Abduction 15 reps   Lumbar Exercises: Prone   Straight Leg Raise 15 reps;Other (comment)  with hip ER to improve VMO strength    Knee/Hip Exercises: Supine   Quad Sets Right;Both;1 set;15 reps   Short Arc Quad Sets Strengthening;Right;15 reps;1 set   Heel Slides 15 reps;Right;AAROM;Other (comment)  with strap    Knee Extension AAROM;Right   Knee Extension Limitations 0 deg    Knee Flexion AAROM;Right   Knee Flexion Limitations 104 deg   Manual Therapy   Passive ROM into R knee extension and flexion x 10 reps with 5 sec hold                 PT Education - 10/27/15 0950    Education provided Yes   Education Details Educated pt on current HEP, use of cryotherapy, proper R knee alignment with CKC ther ex, and R LE elevation    Person(s) Educated Patient   Methods Explanation   Comprehension Verbalized  understanding;Returned demonstration          PT Short Term Goals - 10/13/15 1250    PT SHORT TERM GOAL #1   Title Pt to be independent in HEP in order to achiever goals in an expedient manner    Time 1   Period Weeks   Status New   PT SHORT TERM GOAL #2   Title Pt knee ROM to be 0-105 degrees to allow pt to have a normalized gait    Time 1   Period Weeks   Status New   PT SHORT TERM GOAL #3   Title Pt knee and back pain to be at the most a 6/10 for improved quality of life   Time 1   Period Weeks           PT Long Term Goals - 10/20/15 1127    PT LONG TERM GOAL #1   Title Pt to be independent in an advance HEP to obtain goals in an expedent manner.    Time 2   Period Weeks   Status On-going   PT LONG TERM GOAL #2   Title Pt to be able to sit for 30 minutes with comfort in order to be able to sit for a meal in comfort.    Time 2   Period Weeks   Status On-going   PT LONG TERM GOAL #3   Title Pt to be able to walk for 20 minutes without increased pain to be able to take care of her child    Time 2   Period Weeks   Status On-going   PT LONG TERM GOAL #4   Title Pt strength to be increased 1/2 grade to be able to squat down and pick items off the ground.    Time 2   Period Weeks   Status On-going   PT LONG TERM GOAL #5   Title Pt to be able to be up for over an hour without increased pain to be able to complete shopping activity for household duties    Time 4   Period Weeks   Additional Long Term Goals   Additional Long Term Goals Yes   PT LONG TERM GOAL #6   Title Pt strength to be increased by one grade to be able to go up and down steps in a reciprocal manner without increased pain    Time 4   Period Weeks   PT LONG TERM GOAL #7  Title Pt pain to be no greater than a 2/10 throughout the day while completing normal ADL's    Time 4   Period Weeks   Status New               Plan - 10/27/15 7829    Clinical Impression Statement Pt is responding  well to PT tx with improved R knee ROM measured this visit, which was measured to 0-104 degrees. Completed mini squats with physio ball on wall with focus on R knee alignment and maintaining core activated. Pt was able to tolerate 15 reps with min increase in R knee pain with last two reps. No additional reps completed. Progressed and added reps to supine R knee and low back ther ex with good tolerance reported. Min tactile and verbal cues required to maintain R knee in TKE during SLR. Added core Isometric ball press in supine with good tolerance and technique demo. Pt tolerated tx well with no residual pain or soreness reported at the completion of today's PT visit. Pt would continue to benefit from skilled PT to address current limitations and impairments. Continue with current POC and progress as tolerated.    Pt will benefit from skilled therapeutic intervention in order to improve on the following deficits Abnormal gait;Decreased activity tolerance;Decreased mobility;Decreased range of motion;Decreased strength;Difficulty walking;Impaired flexibility;Pain   Rehab Potential Good   PT Frequency 3x / week   PT Duration 4 weeks   PT Treatment/Interventions Functional mobility training;Therapeutic activities;Therapeutic exercise;Manual techniques;Patient/family education   PT Next Visit Plan Continue with core and proximal musculature strenghtening, knee mobility.  Begin step up training as knee mobility improves.   PT Home Exercise Plan Reviewed HEP with no changes made this visit    Consulted and Agree with Plan of Care Patient        Problem List Patient Active Problem List   Diagnosis Date Noted  . Exercise-induced asthma 03/05/2013    Bonnee Quin, PT, DPT   10/27/2015, 12:13 PM  [facility] [address] [place], [place], [address] Phone: [phone]   Fax:  [phone]  Name: AMANDEEP HOGSTON MRN: [id number] Date of Birth: 1986/06/09
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
## 2015-10-29 ENCOUNTER — Telehealth (HOSPITAL_COMMUNITY): Payer: Self-pay

## 2015-10-29 ENCOUNTER — Ambulatory Visit (HOSPITAL_COMMUNITY): Payer: 59 | Admitting: Physical Therapy

## 2015-10-29 NOTE — Telephone Encounter (Signed)
Patient not feeling well.

## 2015-10-30 ENCOUNTER — Encounter: Payer: Self-pay | Admitting: Family Medicine

## 2015-10-31 ENCOUNTER — Telehealth (HOSPITAL_COMMUNITY): Payer: Self-pay

## 2015-10-31 ENCOUNTER — Ambulatory Visit (HOSPITAL_COMMUNITY): Payer: 59

## 2015-10-31 NOTE — Telephone Encounter (Signed)
Jacqueline Swanson was called at 9:55 am on 10/31/15 by therapist to inform of "No show" to her 9:30 am PT appointment. Left message on her cell phone requesting her to call the clinic back to provide reason and to ensure that she has her appointments set for next week.   Bonnee Quin, PT, DPT

## 2015-11-03 ENCOUNTER — Ambulatory Visit (HOSPITAL_COMMUNITY): Payer: 59 | Admitting: Physical Therapy

## 2015-11-03 DIAGNOSIS — M25561 Pain in right knee: Secondary | ICD-10-CM | POA: Diagnosis not present

## 2015-11-03 DIAGNOSIS — G8929 Other chronic pain: Secondary | ICD-10-CM

## 2015-11-03 DIAGNOSIS — IMO0002 Reserved for concepts with insufficient information to code with codable children: Secondary | ICD-10-CM

## 2015-11-03 DIAGNOSIS — R269 Unspecified abnormalities of gait and mobility: Secondary | ICD-10-CM

## 2015-11-03 DIAGNOSIS — M2560 Stiffness of unspecified joint, not elsewhere classified: Secondary | ICD-10-CM

## 2015-11-03 DIAGNOSIS — R29898 Other symptoms and signs involving the musculoskeletal system: Secondary | ICD-10-CM

## 2015-11-03 NOTE — Therapy (Signed)
[facility] [address] [place], [place], [address] Phone: [phone]   Fax:  [phone]  Physical Therapy Treatment  Patient Details  Name: Jacqueline Swanson MRN: [id number] Date of Birth: 1986-08-16 Referring Provider: Ardyth Gal PCP; [facility]  Encounter Date: 11/03/2015      PT End of Session - 11/03/15 1218    Visit Number 6   Number of Visits 12   Date for PT Re-Evaluation 11/12/15   Authorization Type UHC   Authorization - Visit Number 6   Authorization - Number of Visits 10   PT Start Time 1021   PT Stop Time 1059   PT Time Calculation (min) 38 min   Activity Tolerance Patient tolerated treatment well  pain noted with knee flexion stretches   Behavior During Therapy [facility] for tasks assessed/performed      Past Medical History  Diagnosis Date  . Asthma     Past Surgical History  Procedure Laterality Date  . Dilation and curettage of uterus    . Tonsillectomy and adenoidectomy      There were no vitals filed for this visit.  Visit Diagnosis:  Knee pain, chronic, right  Abnormality of gait  Right leg weakness  Stiffness of joint of lower extremity      Subjective Assessment - 11/03/15 1027    Subjective Going back to the physician on monday. Still having pain and tight. Wishing it was going faster.    Currently in Pain? Yes   Pain Score 4    Pain Location Knee   Pain Orientation Right   Pain Descriptors / Indicators Aching   Pain Type Acute pain   Aggravating Factors  lots of walking, pivoting on it, squatting   Pain Relieving Factors ice                         [facility] Adult PT Treatment/Exercise - 11/03/15 0001    Lumbar Exercises: Stretches   Active Hamstring Stretch 3 reps;30 seconds;Other (comment)  R LE    Active Hamstring Stretch Limitations supine with rope    Single Knee to Chest Stretch 20 seconds;5 reps   Single Knee to Chest Stretch Limitations  support provided to control knee flexion due to pain   Quad Stretch Limitations attempted, limited by sharp patellar pain, empty endfeel   Lumbar Exercises: Standing   Functional Squats 10 reps;3 seconds   Functional Squats Limitations squat to 35 degees with physio ball on wall    Lumbar Exercises: Supine   Bridge 10 reps   Bridge Limitations cues for even weight   Straight Leg Raise 10 reps;5 seconds  2 sets   Lumbar Exercises: Sidelying   Hip Abduction 10 reps   Hip Abduction Weights (lbs) 2 sets   Lumbar Exercises: Prone   Straight Leg Raise --   Straight Leg Raises Limitations --   Knee/Hip Exercises: Standing   Heel Raises 1 set;10 reps;Right   Knee Flexion 10 reps   Knee Flexion Limitations single leg Rt   Forward Step Up Right;2 sets;10 reps;Step Height: 4"   Functional Squat 1 set;10 reps   Functional Squat Limitations ball on wall, painfree range                PT Education - 11/03/15 1217    Education provided Yes   Education Details step-up technique, pain vs stretch with HEP.   Person(s) Educated Patient   Methods Explanation;Demonstration;Tactile cues;Verbal  cues   Comprehension Verbalized understanding;Returned demonstration          PT Short Term Goals - 10/13/15 1250    PT SHORT TERM GOAL #1   Title Pt to be independent in HEP in order to achiever goals in an expedient manner    Time 1   Period Weeks   Status New   PT SHORT TERM GOAL #2   Title Pt knee ROM to be 0-105 degrees to allow pt to have a normalized gait    Time 1   Period Weeks   Status New   PT SHORT TERM GOAL #3   Title Pt knee and back pain to be at the most a 6/10 for improved quality of life   Time 1   Period Weeks           PT Long Term Goals - 10/20/15 1127    PT LONG TERM GOAL #1   Title Pt to be independent in an advance HEP to obtain goals in an expedent manner.    Time 2   Period Weeks   Status On-going   PT LONG TERM GOAL #2   Title Pt to be able to sit  for 30 minutes with comfort in order to be able to sit for a meal in comfort.    Time 2   Period Weeks   Status On-going   PT LONG TERM GOAL #3   Title Pt to be able to walk for 20 minutes without increased pain to be able to take care of her child    Time 2   Period Weeks   Status On-going   PT LONG TERM GOAL #4   Title Pt strength to be increased 1/2 grade to be able to squat down and pick items off the ground.    Time 2   Period Weeks   Status On-going   PT LONG TERM GOAL #5   Title Pt to be able to be up for over an hour without increased pain to be able to complete shopping activity for household duties    Time 4   Period Weeks   Additional Long Term Goals   Additional Long Term Goals Yes   PT LONG TERM GOAL #6   Title Pt strength to be increased by one grade to be able to go up and down steps in a reciprocal manner without increased pain    Time 4   Period Weeks   PT LONG TERM GOAL #7   Title Pt pain to be no greater than a 2/10 throughout the day while completing normal ADL's    Time 4   Period Weeks   Status New               Plan - 11/03/15 1220    Clinical Impression Statement During session, the patient reported sharp patellar pain with knee flexion stretches. Noted empty endfeel as pain reported as limiting factor with ROM. Able to continue to advance strengthening with step-up today and continued with squats. Patient denied any pain with these exercises today but reported and observed signs of fatigue noted.    PT Next Visit Plan Continue with core and proximal musculature strenghtening, knee mobility.  Check  step ups and response after last session.    PT Home Exercise Plan If tolerated well, add step ups to HEP.         Problem List Patient Active Problem List   Diagnosis Date Noted  .  Exercise-induced asthma 03/05/2013    Christiane Ha, PT, CSCS  11/03/2015, 12:25 PM  [facility] [address]  [place], [place], [address] Phone: [phone]   Fax:  [phone]  Name: Jacqueline Swanson MRN: [id number] Date of Birth: 28-Sep-1985

## 2015-11-06 ENCOUNTER — Ambulatory Visit (HOSPITAL_COMMUNITY): Payer: 59 | Admitting: Physical Therapy

## 2015-11-06 DIAGNOSIS — M545 Low back pain, unspecified: Secondary | ICD-10-CM

## 2015-11-06 DIAGNOSIS — G8929 Other chronic pain: Secondary | ICD-10-CM

## 2015-11-06 DIAGNOSIS — R269 Unspecified abnormalities of gait and mobility: Secondary | ICD-10-CM

## 2015-11-06 DIAGNOSIS — IMO0002 Reserved for concepts with insufficient information to code with codable children: Secondary | ICD-10-CM

## 2015-11-06 DIAGNOSIS — M25561 Pain in right knee: Principal | ICD-10-CM

## 2015-11-06 DIAGNOSIS — R29898 Other symptoms and signs involving the musculoskeletal system: Secondary | ICD-10-CM

## 2015-11-06 DIAGNOSIS — M2560 Stiffness of unspecified joint, not elsewhere classified: Secondary | ICD-10-CM

## 2015-11-06 NOTE — Therapy (Addendum)
Jacqueline Swanson, [place], [address] Phone: [phone]   Fax:  [phone]  Physical Therapy Treatment  Patient Details  Name: Jacqueline Swanson MRN: [id number] Date of Birth: [birth year] Referring Provider: Baltazar Apo PCP; [facility]  Encounter Date: 11/06/2015      PT End of Session - 11/06/15 1056    Visit Number 7   Number of Visits 12   Date for PT Re-Evaluation 11/12/15   Authorization Type [employer]   Authorization - Visit Number 7   Authorization - Number of Visits 10   PT Start Time 1020   PT Stop Time 1100   PT Time Calculation (min) 40 min   Activity Tolerance Patient tolerated treatment well  pain noted with knee flexion stretches   Behavior During Therapy WFL for tasks assessed/performed      Past Medical History  Diagnosis Date  . Asthma     Past Surgical History  Procedure Laterality Date  . Dilation and curettage of uterus    . Tonsillectomy and adenoidectomy      There were no vitals filed for this visit.  Visit Diagnosis:  Knee pain, chronic, right  Abnormality of gait  Right leg weakness  Stiffness of joint of lower extremity  Midline low back pain without sciatica      Subjective Assessment - 11/06/15 1101    Subjective Going back to the physician on monday. Still having pain and tight. Wishing it was going faster.    Currently in Pain? Yes   Pain Score 4    Pain Location Knee   Pain Orientation Right   Pain Descriptors / Indicators Aching;Burning   Pain Type Acute pain                         [facility] Adult PT Treatment/Exercise - 11/06/15 0001    Lumbar Exercises: Stretches   Active Hamstring Stretch 3 reps;30 seconds;Other (comment)   Active Hamstring Stretch Limitations standingl 12"   Lumbar Exercises: Standing   Functional Squats 10 reps;3 seconds   Functional Squats Limitations squat to 35 degees with physio ball on wall    Lumbar  Exercises: Supine   Ab Set 15 reps   AB Set Limitations 3 second holds    Bridge 15 reps   Straight Leg Raise 15 reps   Lumbar Exercises: Sidelying   Clam 15 reps;Other (comment)   Hip Abduction 15 reps   Hip Abduction Weights (lbs) 2 sets   Lumbar Exercises: Prone   Straight Leg Raise 15 reps;Other (comment)   Other Prone Lumbar Exercises hamstring curls 15reps   Knee/Hip Exercises: Standing   Lateral Step Up Right;10 reps;Step Height: 4";Hand Hold: 0   Forward Step Up Right;10 reps;Step Height: 4";Hand Hold: 0   Functional Squat 1 set;10 reps   Functional Squat Limitations ball on wall, painfree range                  PT Short Term Goals - 10/13/15 1250    PT SHORT TERM GOAL #1   Title Pt to be independent in HEP in order to achiever goals in an expedient manner    Time 1   Period Weeks   Status New   PT SHORT TERM GOAL #2   Title Pt knee ROM to be 0-105 degrees to allow pt to have a normalized gait    Time 1   Period Weeks   Status  New   PT SHORT TERM GOAL #3   Title Pt knee and back pain to be at the most a 6/10 for improved quality of life   Time 1   Period Weeks           PT Long Term Goals - 10/20/15 1127    PT LONG TERM GOAL #1   Title Pt to be independent in an advance HEP to obtain goals in an expedent manner.    Time 2   Period Weeks   Status On-going   PT LONG TERM GOAL #2   Title Pt to be able to sit for 30 minutes with comfort in order to be able to sit for a meal in comfort.    Time 2   Period Weeks   Status On-going   PT LONG TERM GOAL #3   Title Pt to be able to walk for 20 minutes without increased pain to be able to take care of her child    Time 2   Period Weeks   Status On-going   PT LONG TERM GOAL #4   Title Pt strength to be increased 1/2 grade to be able to squat down and pick items off the ground.    Time 2   Period Weeks   Status On-going   PT LONG TERM GOAL #5   Title Pt to be able to be up for over an hour without  increased pain to be able to complete shopping activity for household duties    Time 4   Period Weeks   Additional Long Term Goals   Additional Long Term Goals Yes   PT LONG TERM GOAL #6   Title Pt strength to be increased by one grade to be able to go up and down steps in a reciprocal manner without increased pain    Time 4   Period Weeks   PT LONG TERM GOAL #7   Title Pt pain to be no greater than a 2/10 throughout the day while completing normal ADL's    Time 4   Period Weeks   Status New               Plan - 11/06/15 1056    Clinical Impression Statement Continued focus on improving ROM and strength in LE.  Pt able to complete most exercises without pain with most pain reported after 90 degrees of knee flexion.  PRogressed wtih lateral and forward step downs this session with slight increased pain noted immediately at EOS but then subsided prior to leaving.  Pt returns to MD on Monday to discuss further options regarding fractured patella.      PT Next Visit Plan Continue with core and proximal musculature strenghtening, knee mobility.  Await further plans from MD whether patient is to continue or have surgery.         Problem List Patient Active Problem List   Diagnosis Date Noted  . 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 [address] [place], [place], [address] Phone: [phone]   Fax:  [phone]  Name: Jacqueline Swanson MRN: [id number] Date of Birth: [birth year]    PHYSICAL THERAPY DISCHARGE SUMMARY  Visits from Start of Care: 7  Current functional level related to goals / functional outcomes: No change   Remaining deficits: same   Education / Equipment: HEP  Plan: Patient agrees to discharge.  Patient goals  were not met. Patient is being discharged due to lack of progress.  ?????       Rayetta Humphrey, [place] CLT [phone]

## 2015-11-10 ENCOUNTER — Telehealth (HOSPITAL_COMMUNITY): Payer: Self-pay | Admitting: Physical Therapy

## 2015-11-10 ENCOUNTER — Encounter (HOSPITAL_COMMUNITY): Payer: 59

## 2015-11-10 NOTE — Telephone Encounter (Signed)
Md advised pt to be put on hold until MRI is done, patient will call us back with instructions> NF 11/10/15

## 2015-11-11 ENCOUNTER — Encounter (HOSPITAL_COMMUNITY): Payer: 59 | Admitting: Physical Therapy

## 2015-11-13 ENCOUNTER — Encounter (HOSPITAL_COMMUNITY): Payer: 59 | Admitting: Physical Therapy

## 2015-11-21 DIAGNOSIS — Z029 Encounter for administrative examinations, unspecified: Secondary | ICD-10-CM

## 2016-05-26 DIAGNOSIS — Z0289 Encounter for other administrative examinations: Secondary | ICD-10-CM
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
## 2017-08-08 ENCOUNTER — Encounter: Payer: Self-pay | Admitting: Nurse Practitioner

## 2017-08-08 ENCOUNTER — Ambulatory Visit (INDEPENDENT_AMBULATORY_CARE_PROVIDER_SITE_OTHER): Payer: Managed Care, Other (non HMO) | Admitting: Nurse Practitioner

## 2017-08-08 VITALS — BP 98/68 | HR 74 | Temp 98.1°F | Ht 62.0 in | Wt 182.0 lb

## 2017-08-08 DIAGNOSIS — R5383 Other fatigue: Secondary | ICD-10-CM

## 2017-08-08 DIAGNOSIS — Z79899 Other long term (current) drug therapy: Secondary | ICD-10-CM | POA: Diagnosis not present

## 2017-08-08 DIAGNOSIS — G43009 Migraine without aura, not intractable, without status migrainosus: Secondary | ICD-10-CM | POA: Diagnosis not present

## 2017-08-08 DIAGNOSIS — I951 Orthostatic hypotension: Secondary | ICD-10-CM | POA: Diagnosis not present

## 2017-08-08 NOTE — Patient Instructions (Signed)

## 2017-08-09 ENCOUNTER — Encounter: Payer: Self-pay | Admitting: Nurse Practitioner

## 2017-08-09 DIAGNOSIS — G43009 Migraine without aura, not intractable, without status migrainosus: Secondary | ICD-10-CM | POA: Insufficient documentation

## 2017-08-09 DIAGNOSIS — I951 Orthostatic hypotension: Secondary | ICD-10-CM | POA: Insufficient documentation

## 2017-08-09 MED ORDER — NORTRIPTYLINE HCL 25 MG PO CAPS: 25.0000 mg | ORAL_CAPSULE | Freq: Every day | ORAL | 2 refills | Status: AC
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
## 2017-08-10 LAB — BASIC METABOLIC PANEL
BUN/Creatinine Ratio: 14 (ref 9–23)
BUN: 9 mg/dL (ref 6–20)
CALCIUM: 9.2 mg/dL (ref 8.7–10.2)
CO2: 24 mmol/L (ref 20–29)
CREATININE: 0.64 mg/dL (ref 0.57–1.00)
Chloride: 100 mmol/L (ref 96–106)
GFR, EST AFRICAN AMERICAN: 139 mL/min/{1.73_m2} (ref >59)
GFR, EST NON AFRICAN AMERICAN: 121 mL/min/{1.73_m2} (ref >59)
Glucose: 97 mg/dL (ref 65–99)
POTASSIUM: 4.1 mmol/L (ref 3.5–5.2)
Sodium: 138 mmol/L (ref 134–144)

## 2017-08-10 LAB — HEPATIC FUNCTION PANEL
ALK PHOS: 85 IU/L (ref 39–117)
ALT: 10 IU/L (ref 0–32)
AST: 13 IU/L (ref 0–40)
Albumin: 4.4 g/dL (ref 3.5–5.5)
BILIRUBIN, DIRECT: 0.09 mg/dL (ref 0.00–0.40)
Bilirubin Total: 0.2 mg/dL (ref 0.0–1.2)
TOTAL PROTEIN: 6.8 g/dL (ref 6.0–8.5)

## 2017-08-10 LAB — CBC WITH DIFFERENTIAL/PLATELET
BASOS ABS: 0 10*3/uL (ref 0.0–0.2)
Basos: 0 %
EOS (ABSOLUTE): 0.2 10*3/uL (ref 0.0–0.4)
EOS: 2 %
HEMATOCRIT: 39.2 % (ref 34.0–46.6)
HEMOGLOBIN: 12.7 g/dL (ref 11.1–15.9)
IMMATURE GRANS (ABS): 0 10*3/uL (ref 0.0–0.1)
IMMATURE GRANULOCYTES: 0 %
LYMPHS ABS: 2.7 10*3/uL (ref 0.7–3.1)
LYMPHS: 27 %
MCH: 27.3 pg (ref 26.6–33.0)
MCHC: 32.4 g/dL (ref 31.5–35.7)
MCV: 84 fL (ref 79–97)
MONOCYTES: 5 %
Monocytes Absolute: 0.5 10*3/uL (ref 0.1–0.9)
NEUTROS PCT: 66 %
Neutrophils Absolute: 6.7 10*3/uL (ref 1.4–7.0)
Platelets: 348 10*3/uL (ref 150–379)
RBC: 4.66 x10E6/uL (ref 3.77–5.28)
RDW: 13.6 % (ref 12.3–15.4)
WBC: 10.2 10*3/uL (ref 3.4–10.8)

## 2017-08-10 LAB — TSH: TSH: 2.62 u[IU]/mL (ref 0.450–4.500)

## 2017-08-16 ENCOUNTER — Telehealth: Payer: Self-pay | Admitting: Nurse Practitioner

## 2017-08-16 NOTE — Telephone Encounter (Signed)
Patient's insurance [employer] prefers that patient uses [facility] because the cost is cheaper for patient. [facility] for 08/22/17 at 11:00am.  Left message return call to inform patient of appointment time. Patient is aware that we had to change from [facility] to [facility].

## 2017-08-16 NOTE — Telephone Encounter (Signed)
Spoke with patient and informed her that her MRI is Scheduled for Monday December 3rd at 11:00 am at [facility]. Patient verbalized understanding.

## 2017-08-22 ENCOUNTER — Ambulatory Visit (HOSPITAL_COMMUNITY): Payer: No Typology Code available for payment source

## 2017-08-23 ENCOUNTER — Encounter: Payer: 59 | Admitting: Nurse Practitioner

## 2017-09-01 ENCOUNTER — Ambulatory Visit (INDEPENDENT_AMBULATORY_CARE_PROVIDER_SITE_OTHER): Payer: Managed Care, Other (non HMO) | Admitting: Nurse Practitioner

## 2017-09-01 ENCOUNTER — Encounter: Payer: Self-pay | Admitting: Nurse Practitioner

## 2017-09-01 VITALS — BP 126/80 | Ht 61.0 in | Wt 178.0 lb

## 2017-09-01 DIAGNOSIS — Z01419 Encounter for gynecological examination (general) (routine) without abnormal findings: Secondary | ICD-10-CM

## 2017-09-01 MED ORDER — RIZATRIPTAN BENZOATE 10 MG PO TBDP: 10.0000 mg | ORAL_TABLET | ORAL | 0 refills | Status: AC | PRN

## 2017-09-01 MED ORDER — NORETHIN-ETH ESTRAD-FE BIPHAS 1 MG-10 MCG / 10 MCG PO TABS: 1.0000 | ORAL_TABLET | Freq: Every day | ORAL | 11 refills | Status: AC

## 2017-09-03 ENCOUNTER — Encounter: Payer: Self-pay | Admitting: Nurse Practitioner

## 2017-09-03 NOTE — Progress Notes (Addendum)
Subjective:    Patient ID: Jacqueline Swanson, female    DOB: 1985-10-07, 31 y.o.   MRN: [id number]  HPI presents for her wellness exam.  Her current Nexplanon has expired.  Patient has plans to have it removed.  While on hormones, experience mood swings, depression and breakthrough bleeding.  Since then has had regular cycles with slightly heavy flow.  Same sexual partner.  Would like to start birth control pills.  Is not currently taking the nortriptyline.  Headaches have been improved overall having-2/month.  No change in symptomatology.  Denies visual problems.  Regular dental exams.  Has tried to eat on a regular basis and keep hydrated.  Has had fewer problems with her blood pressure which she thinks may have been triggering her headaches.  Regular activity.  Defers pelvic and Pap smear today since she is on her menstrual cycle.  Denies any GU lesions or sores.    Review of Systems  Constitutional: Negative for activity change, appetite change and fatigue.  HENT: Negative for dental problem, ear pain, sinus pressure and sore throat.   Eyes: Negative for visual disturbance.  Respiratory: Negative for cough, chest tightness, shortness of breath and wheezing.   Cardiovascular: Negative for chest pain.  Gastrointestinal: Negative for abdominal distention, abdominal pain, constipation, diarrhea, nausea and vomiting.  Genitourinary: Negative for difficulty urinating, dysuria, enuresis, frequency, genital sores, menstrual problem, pelvic pain, urgency and vaginal discharge.  Neurological: Positive for headaches.   Depression screen PHQ 2/9 09/02/2017  Decreased Interest 0  Down, Depressed, Hopeless 0  PHQ - 2 Score 0        Objective:   Physical Exam  Constitutional: She is oriented to person, place, and time. She appears well-developed. No distress.  HENT:  Right Ear: External ear normal.  Left Ear: External ear normal.  Mouth/Throat: Oropharynx is clear and moist.  Neck: Normal range  of motion. Neck supple. No tracheal deviation present. No thyromegaly present.  Cardiovascular: Normal rate, regular rhythm and normal heart sounds. Exam reveals no gallop.  No murmur heard. Pulmonary/Chest: Effort normal and breath sounds normal. Right breast exhibits no inverted nipple, no mass, no skin change and no tenderness. Left breast exhibits no inverted nipple, no mass, no skin change and no tenderness. Breasts are symmetrical.  Axillae no adenopathy.   Abdominal: Soft. She exhibits no distension. There is no tenderness.  Genitourinary:  Genitourinary Comments: Deferred due to menses.   Lymphadenopathy:    She has no cervical adenopathy.  Neurological: She is alert and oriented to person, place, and time.  Skin: Skin is warm and dry. No rash noted.  Psychiatric: She has a normal mood and affect. Her behavior is normal.  Vitals reviewed. BP on recheck right arm sitting 126/76 and standing 122/70.  MRI of the brain without contrast dated 08/26/17 was normal. See scanned report.        Assessment & Plan:  Well woman exam  Meds ordered this encounter  Medications  . rizatriptan (MAXALT-MLT) 10 MG disintegrating tablet    Sig: Take 1 tablet (10 mg total) by mouth as needed for migraine. May repeat in 2 hours if needed    Dispense:  10 tablet    Refill:  0    Order Specific Question:   Supervising Provider    Answer:   Merlyn AlbertLUKING, WILLIAM S [[id number]]  . Norethindrone-Ethinyl Estradiol-Fe Biphas (LO LOESTRIN FE) 1 MG-10 MCG / 10 MCG tablet    Sig: Take 1 tablet by mouth daily.  Dispense:  1 Package    Refill:  11    Order Specific Question:   Supervising Provider    Answer:   Merlyn AlbertLUKING, WILLIAM S [[id number]]   Start oc this Sunday. Use back up method first pack. Call back if any adverse effects or bleeding. Since headaches have reduced in number, use Maxalt as directed. Also recommend office visit at a later date just for PAP and pelvic exam. Return in about 1 year (around 09/01/2018) for  physical.

## 2017-11-16 ENCOUNTER — Encounter: Payer: Self-pay | Admitting: Nurse Practitioner

## 2017-11-16 ENCOUNTER — Other Ambulatory Visit: Payer: Self-pay | Admitting: Nurse Practitioner

## 2017-11-16 MED ORDER — CEPHALEXIN 500 MG PO CAPS: 500.0000 mg | ORAL_CAPSULE | Freq: Three times a day (TID) | ORAL | 0 refills | Status: AC

## 2019-10-22 ENCOUNTER — Encounter: Payer: Self-pay | Admitting: Family Medicine
